# Patient Record
Sex: Female | Born: 2000 | Hispanic: No | Marital: Single | Smoking: Never smoker
Health system: Southern US, Community
[De-identification: ages and names within clinical notes are randomized; demographics above are authoritative.]

## PROBLEM LIST (undated history)

## (undated) DIAGNOSIS — H6690 Otitis media, unspecified, unspecified ear: Secondary | ICD-10-CM

## (undated) DIAGNOSIS — H539 Unspecified visual disturbance: Secondary | ICD-10-CM

## (undated) DIAGNOSIS — Q315 Congenital laryngomalacia: Secondary | ICD-10-CM

## (undated) DIAGNOSIS — J4520 Mild intermittent asthma, uncomplicated: Secondary | ICD-10-CM

## (undated) HISTORY — PX: WISDOM TOOTH EXTRACTION: SHX21

## (undated) HISTORY — DX: Congenital laryngomalacia: Q31.5

## (undated) HISTORY — DX: Unspecified visual disturbance: H53.9

## (undated) HISTORY — DX: Mild intermittent asthma, uncomplicated: J45.20

## (undated) HISTORY — DX: Otitis media, unspecified, unspecified ear: H66.90

---
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
## 2022-01-25 ENCOUNTER — Encounter: Payer: Self-pay | Admitting: Licensed Clinical Social Worker

## 2022-01-25 NOTE — BH Specialist Note (Incomplete)
Integrated Behavioral Health via Telemedicine Visit  01/25/2022 Martha Perry [id number]  Number of Integrated Behavioral Health Clinician visits: 5/6 Session Start time:  Session End time: No data recorded Total time in minutes: No data recorded  Referring Provider: Dr. Karilyn Cota Patient/Family location: Home [facility] Provider location: Clinic All persons participating in visit: Patient and Clinician  Types of Service: Individual psychotherapy and Video visit  I connected with [name] or Video Enabled Telemedicine Application  (Video is Caregility application) and verified that I am speaking with the correct person using two identifiers. Discussed confidentiality: Yes   I discussed the limitations of telemedicine and the availability of in person appointments.  Discussed there is a possibility of technology failure and discussed alternative modes of communication if that failure occurs.  I discussed that engaging in this telemedicine visit, they consent to the provision of behavioral healthcare and the services will be billed under their insurance.  Patient and/or legal guardian expressed understanding and consented to Telemedicine visit: Yes   Presenting Concerns: Patient and/or family reports the following symptoms/concerns: *** Duration of problem: ***; Severity of problem: {Mild/Moderate/Severe:20260}  Patient and/or Family's Strengths/Protective Factors: {CHL AMB BH PROTECTIVE FACTORS:[phone]}  Goals Addressed: Patient will:  Reduce symptoms of: {IBH Symptoms:21014056}   Increase knowledge and/or ability of: {IBH Patient Tools:21014057}   Demonstrate ability to: {IBH Goals:21014053}  Progress towards Goals: {CHL AMB BH PROGRESS TOWARDS GOALS:[phone]}  Interventions: Interventions utilized:  {IBH Interventions:21014054} Standardized Assessments completed: {IBH Screening Tools:21014051}  Patient and/or Family Response: ***  Assessment: Patient currently  experiencing ***.   Patient may benefit from ***.  Plan: Follow up with behavioral health clinician on : *** Behavioral recommendations: *** Referral(s): {IBH Referrals:21014055}  I discussed the assessment and treatment plan with the patient and/or parent/guardian. They were provided an opportunity to ask questions and all were answered. They agreed with the plan and demonstrated an understanding of the instructions.   They were advised to call back or seek an in-person evaluation if the symptoms worsen or if the condition fails to improve as anticipated.  Katheran Awe, [facility]

## 2022-03-16 ENCOUNTER — Telehealth: Payer: Self-pay | Admitting: Pediatrics

## 2022-03-16 NOTE — Telephone Encounter (Signed)
Ok

## 2022-03-16 NOTE — Telephone Encounter (Signed)
Patent emailed in [employer] assessment forms for college. Patient is requesting forms please be filled out by the second week of August and faxed back to [employer]. Please review and place in outgoing box for processing.

## 2022-03-24 NOTE — Telephone Encounter (Signed)
Completed orders scanned to pt. Chart. Faxed to [employer] at pt. Request , Pt. Called for original copy pick up.

## 2022-05-12 ENCOUNTER — Ambulatory Visit (HOSPITAL_BASED_OUTPATIENT_CLINIC_OR_DEPARTMENT_OTHER): Payer: Medicaid Other | Admitting: Nurse Practitioner

## 2022-06-09 ENCOUNTER — Encounter (HOSPITAL_BASED_OUTPATIENT_CLINIC_OR_DEPARTMENT_OTHER): Payer: Self-pay | Admitting: Nurse Practitioner

## 2022-06-09 ENCOUNTER — Ambulatory Visit (INDEPENDENT_AMBULATORY_CARE_PROVIDER_SITE_OTHER): Payer: Medicaid Other | Admitting: Nurse Practitioner

## 2022-06-09 ENCOUNTER — Other Ambulatory Visit (HOSPITAL_BASED_OUTPATIENT_CLINIC_OR_DEPARTMENT_OTHER): Payer: Self-pay

## 2022-06-09 VITALS — BP 106/70 | HR 70 | Ht 63.0 in | Wt 146.2 lb

## 2022-06-09 DIAGNOSIS — E010 Iodine-deficiency related diffuse (endemic) goiter: Secondary | ICD-10-CM | POA: Diagnosis not present

## 2022-06-09 DIAGNOSIS — L749 Eccrine sweat disorder, unspecified: Secondary | ICD-10-CM

## 2022-06-09 DIAGNOSIS — J4520 Mild intermittent asthma, uncomplicated: Secondary | ICD-10-CM

## 2022-06-09 DIAGNOSIS — L910 Hypertrophic scar: Secondary | ICD-10-CM

## 2022-06-09 DIAGNOSIS — Z0000 Encounter for general adult medical examination without abnormal findings: Secondary | ICD-10-CM | POA: Diagnosis not present

## 2022-06-09 MED ORDER — ALBUTEROL SULFATE HFA 108 (90 BASE) MCG/ACT IN AERS: 1.0000 | INHALATION_SPRAY | RESPIRATORY_TRACT | 11 refills | Status: AC | PRN

## 2022-06-09 NOTE — Patient Instructions (Signed)
Thank you for choosing [place] [facility] for your Primary Care needs. I am excited for the opportunity to partner with you to meet your health care goals. It was a pleasure meeting you today!  Recommendations from today's visit: I will let you know what your labs show once these come back. If your thyroid labs are normal, we can consider an ultrasound of the thyroid just to make sure the tissue does not look abnormal or enlarged.   Information on diet, exercise, and health maintenance recommendations are listed below. This is information to help you be sure you are on track for optimal health and monitoring.   Please look over this and let us know if you have any questions or if you have completed any of the health maintenance outside of [place] so that we can be sure your records are up to date.  ___________________________________________________________ About Me: I am an Adult-Geriatric Nurse Practitioner with a background in caring for patients for more than 20 years with a strong intensive care background. I provide primary care and sports medicine services to patients age 27 and older within this office. My education had a strong focus on caring for the older adult population, which I am passionate about. I am also the director of the APP Fellowship with [facility].   My desire is to provide you with the best service through preventive medicine and supportive care. I consider you a part of the medical team and value your input. I work diligently to ensure that you are heard and your needs are met in a safe and effective manner. I want you to feel comfortable with me as your provider and want you to know that your health concerns are important to me.  For your information, our office hours are: Monday, Tuesday, and Thursday 8:00 AM - 5:00 PM Wednesday and Friday 8:00 AM - 12:00 PM.   In my time away from the office I am teaching new APP's within the system and  am unavailable, but my partner, Dr. Burnard Bunting is in the office for emergent needs.   If you have questions or concerns, please call our office at [phone] or send [place] a MyChart message and we will respond as quickly as possible.  ____________________________________________________________ MyChart:  For all urgent or time sensitive needs we ask that you please call the office to avoid delays. Our number is [phone]. MyChart is not constantly monitored and due to the large volume of messages a day, replies may take up to 72 business hours.  MyChart Policy: MyChart allows for you to see your visit notes, after visit summary, provider recommendations, lab and tests results, make an appointment, request refills, and contact your provider or the office for non-urgent questions or concerns. Providers are seeing patients during normal business hours and do not have built in time to review MyChart messages.  We ask that you allow a minimum of 3 business days for responses to [employer]. For this reason, please do not send urgent requests through [place]. Please call the office at [phone]. New and ongoing conditions may require a visit. We have virtual and in person visit available for your convenience.  Complex MyChart concerns may require a visit. Your provider may request you schedule a virtual or in person visit to ensure we are providing the best care possible. MyChart messages sent after 11:00 AM on Friday will not be received by the provider until Monday morning.    Lab  and Test Results: You will receive your lab and test results on MyChart as soon as they are completed and results have been sent by the lab or testing facility. Due to this service, you will receive your results BEFORE your provider.  I review lab and tests results each morning prior to seeing patients. Some results require collaboration with other providers to ensure you are receiving the most appropriate care. For  this reason, we ask that you please allow a minimum of 3-5 business days from the time the ALL results have been received for your provider to receive and review lab and test results and contact you about these.  Most lab and test result comments from the provider will be sent through [place]. Your provider may recommend changes to the plan of care, follow-up visits, repeat testing, ask questions, or request an office visit to discuss these results. You may reply directly to this message or call the office at [phone] to provide information for the provider or set up an appointment. In some instances, you will be called with test results and recommendations. Please let us know if this is preferred and we will make note of this in your chart to provide this for you.    If you have not heard a response to your lab or test results in 5 business days from all results returning to [place], please call the office to let us know. We ask that you please avoid calling prior to this time unless there is an emergent concern. Due to high call volumes, this can delay the resulting process.  After Hours: For all non-emergency after hours needs, please call the office at [phone] and select the option to reach the on-call provider service. On-call services are shared between multiple [place] offices and therefore it will not be possible to speak directly with your provider. On-call providers may provide medical advice and recommendations, but are unable to provide refills for maintenance medications.  For all emergency or urgent medical needs after normal business hours, we recommend that you seek care at the closest Urgent Care or Emergency Department to ensure appropriate treatment in a timely manner.  [facility] [place] has a 24 hour emergency room located on the ground floor for your convenience.   Urgent Concerns During the Business Day Providers are seeing patients from 8AM to [place] with  a busy schedule and are most often not able to respond to non-urgent calls until the end of the day or the next business day. If you should have URGENT concerns during the day, please call and speak to the nurse or schedule a same day appointment so that we can address your concern without delay.   Thank you, again, for choosing me as your health care partner. I appreciate your trust and look forward to learning more about you.   Worthy Keeler, DNP, AGNP-c ___________________________________________________________  Health Maintenance Recommendations Screening Testing Mammogram Every 1 -2 years based on history and risk factors Starting at age 63 Pap Smear Ages 21-39 every 3 years Ages 54-65 every 5 years with HPV testing More frequent testing may be required based on results and history Colon Cancer Screening Every 1-10 years based on test performed, risk factors, and history Starting at age 26 Bone Density Screening Every 2-10 years based on history Starting at age 11 for women Recommendations for men differ based on medication usage, history, and risk factors AAA Screening One time ultrasound Men 64-38 years old who have every  smoked Lung Cancer Screening Low Dose Lung CT every 12 months Age 86-80 years with a 30 pack-year smoking history who still smoke or who have quit within the last 15 years  Screening Labs Routine  Labs: Complete Blood Count (CBC), Complete Metabolic Panel (CMP), Cholesterol (Lipid Panel) Every 6-12 months based on history and medications May be recommended more frequently based on current conditions or previous results Hemoglobin A1c Lab Every 3-12 months based on history and previous results Starting at age 35 or earlier with diagnosis of diabetes, high cholesterol, BMI >26, and/or risk factors Frequent monitoring for patients with diabetes to ensure blood sugar control Thyroid Panel (TSH w/ T3 & T4) Every 6 months based on history, symptoms, and  risk factors May be repeated more often if on medication HIV One time testing for all patients 62 and older May be repeated more frequently for patients with increased risk factors or exposure Hepatitis C One time testing for all patients 39 and older May be repeated more frequently for patients with increased risk factors or exposure Gonorrhea, Chlamydia Every 12 months for all sexually active persons 13-24 years Additional monitoring may be recommended for those who are considered high risk or who have symptoms PSA Men 25-5 years old with risk factors Additional screening may be recommended from age 71-69 based on risk factors, symptoms, and history  Vaccine Recommendations Tetanus Booster All adults every 10 years Flu Vaccine All patients 6 months and older every year COVID Vaccine All patients 12 years and older Initial dosing with booster May recommend additional booster based on age and health history HPV Vaccine 2 doses all patients age 92-26 Dosing may be considered for patients over 26 Shingles Vaccine (Shingrix) 2 doses all adults 39 years and older Pneumonia (Pneumovax 23) All adults 9 years and older May recommend earlier dosing based on health history Pneumonia (Prevnar 50) All adults 32 years and older Dosed 1 year after Pneumovax 23  Additional Screening, Testing, and Vaccinations may be recommended on an individualized basis based on family history, health history, risk factors, and/or exposure.  __________________________________________________________  Diet Recommendations for All Patients  I recommend that all patients maintain a diet low in saturated fats, carbohydrates, and cholesterol. While this can be challenging at first, it is not impossible and small changes can make big differences.  Things to try: Decreasing the amount of soda, sweet tea, and/or juice to one or less per day and replace with water While water is always the first choice, if you  do not like water you may consider adding a water additive without sugar to improve the taste other sugar free drinks Replace potatoes with a brightly colored vegetable at dinner Use healthy oils, such as canola oil or olive oil, instead of butter or hard margarine Limit your bread intake to two pieces or less a day Replace regular pasta with low carb pasta options Bake, broil, or grill foods instead of frying Monitor portion sizes  Eat smaller, more frequent meals throughout the day instead of large meals  An important thing to remember is, if you love foods that are not great for your health, you don't have to give them up completely. Instead, allow these foods to be a reward when you have done well. Allowing yourself to still have special treats every once in a while is a nice way to tell yourself thank you for working hard to keep yourself healthy.   Also remember that every day is a new day. If  you have a bad day and "fall off the wagon", you can still climb right back up and keep moving along on your journey!  We have resources available to help you!  Some websites that may be helpful include: www.http://carter.biz/  Www.VeryWellFit.com _____________________________________________________________  Activity Recommendations for All Patients  I recommend that all adults get at least 20 minutes of moderate physical activity that elevates your heart rate at least 5 days out of the week.  Some examples include: Walking or jogging at a pace that allows you to carry on a conversation Cycling (stationary bike or outdoors) Water aerobics Yoga Weight lifting Dancing If physical limitations prevent you from putting stress on your joints, exercise in a pool or seated in a chair are excellent options.  Do determine your MAXIMUM heart rate for activity: YOUR AGE - 220 = MAX HeartRate   Remember! Do not push yourself too hard.  Start slowly and build up your pace, speed, weight, time in  exercise, etc.  Allow your body to rest between exercise and get good sleep. You will need more water than normal when you are exerting yourself. Do not wait until you are thirsty to drink. Drink with a purpose of getting in at least 8, 8 ounce glasses of water a day plus more depending on how much you exercise and sweat.    If you begin to develop dizziness, chest pain, abdominal pain, jaw pain, shortness of breath, headache, vision changes, lightheadedness, or other concerning symptoms, stop the activity and allow your body to rest. If your symptoms are severe, seek emergency evaluation immediately. If your symptoms are concerning, but not severe, please let us know so that we can recommend further evaluation.

## 2022-06-09 NOTE — Progress Notes (Signed)
BP 106/70   Pulse 70   Ht 5' 3" (1.6 m)   Wt 146 lb 3.2 oz (66.3 kg)   SpO2 98%   BMI 25.90 kg/m    Subjective:    Patient ID: Martha Perry, female    DOB: 2001-02-14, 21 y.o.   MRN: [id number]  HPI: Martha Perry is a 21 y.o. female presenting on 06/09/2022 for comprehensive medical examination.   Current medical concerns include:none  She reports regular vision exams q1-5y: yes She reports regular dental exams q 31m yes Her diet consists of:  no restrictions other than pork She endorses exercise and/or activity of:  walking on campus [place] in[place]  [employer] [place] She denies ETOH use  She denies nictoine use  She denies illegal substance use   She is having menstrual periods.  She  denies abnormal bleeding. She  denies menopausal symptoms.  She is is not sexually active. She currently has 0 sexual partners.  She denies concerns today about STI: testing ordered: no  She endorses concerns about skin changes today -- Keloid She denies concerns about bowel changes today  She denies concerns about bladder changes today   Most Recent Depression Screen:     10/04/2021    5:10 PM 12/25/2019   10:15 AM 10/31/2019    9:17 AM  Depression screen PHQ 2/9  Decreased Interest 1 0 0  Down, Depressed, Hopeless 1 0 2  PHQ - 2 Score 2 0 2  Altered sleeping 1  0  Tired, decreased energy 1  1  Change in appetite 0  2  Feeling bad or failure about yourself  0  1  Trouble concentrating 0  1  Moving slowly or fidgety/restless 0  0  PHQ-9 Score 4  7   Most Recent Anxiety Screen:      No data to display         Most Recent Fall Screen:    12/25/2019   10:15 AM  Fall Risk   Falls in the past year? 0  Number falls in past yr: 0    All ROS negative except what is listed above and in the HPI.   Past medical history, surgical history, medications, allergies, family history and social history reviewed with patient today and changes  made to appropriate areas of the chart.  Past Medical History:  Past Medical History:  Diagnosis Date   Allergic rhinitis 07/26/2011   Asthma, mild intermittent 07/26/2011   asthma exercerbated by colds, ear infections   Laryngomalacia    Otitis media    Twin birth    URI (upper respiratory infection) 01/28/2013   Vision abnormalities    Medications:  Current Outpatient Medications on File Prior to Visit  Medication Sig   fluticasone (FLONASE) 50 MCG/ACT nasal spray 1 spray each nostril once a day as needed congestion.   No current facility-administered medications on file prior to visit.   Surgical History:  Past Surgical History:  Procedure Laterality Date   ADENOIDECTOMY AND MYRINGOTOMY WITH TUBE PLACEMENT Bilateral 09/30/2013   Procedure: BILATERAL  MYRINGOTOMY WITH  TUBE PLACEMENT AND ADENOIDECTOMY;  Surgeon: SAscencion Dike MD;  Location: [facility]  Service: ENT;  Laterality: Bilateral;   Allergies:  Allergies  Allergen Reactions   Other     Multiple environmental/ pollen   Social History:  Social History   Socioeconomic History   Marital status: Single    Spouse name: Not on file  Number of children: Not on file   Years of education: Not on file   Highest education level: Not on file  Occupational History   Not on file  Tobacco Use   Smoking status: Never    Passive exposure: Never   Smokeless tobacco: Never   Tobacco comments:    no smokers in home  Vaping Use   Vaping Use: Never used  Substance and Sexual Activity   Alcohol use: Never   Drug use: Never   Sexual activity: Never  Other Topics Concern   Not on file  Social History Narrative   Lives at home with mother, father, twin sister and younger brother.  Attends [employer] with major in [employer].   Social Determinants of Health   Financial Resource Strain: Not on file  Food Insecurity: Not on file  Transportation Needs: Not on file  Physical Activity: Not  on file  Stress: Not on file  Social Connections: Not on file  Intimate Partner Violence: Not on file   Social History   Tobacco Use  Smoking Status Never   Passive exposure: Never  Smokeless Tobacco Never  Tobacco Comments   no smokers in home   Social History   Substance and Sexual Activity  Alcohol Use Never   Family History:  Family History  Problem Relation Age of Onset   Hyperlipidemia Mother    Hypertension Father        Objective:    BP 106/70   Pulse 70   Ht 5' 3" (1.6 m)   Wt 146 lb 3.2 oz (66.3 kg)   SpO2 98%   BMI 25.90 kg/m   Wt Readings from Last 3 Encounters:  06/09/22 146 lb 3.2 oz (66.3 kg) (79 %, Z= 0.80)*  10/04/21 152 lb (68.9 kg) (85 %, Z= 1.04)*  06/22/20 157 lb (71.2 kg) (90 %, Z= 1.26)*   * Growth percentiles are based on CDC (Girls, 2-20 Years) data.    Physical Exam Vitals and nursing note reviewed.  Constitutional:      General: She is not in acute distress.    Appearance: Normal appearance.  HENT:     Head: Normocephalic and atraumatic.     Right Ear: Hearing, tympanic membrane, ear canal and external ear normal.     Left Ear: Hearing, tympanic membrane, ear canal and external ear normal.     Nose: Nose normal.     Right Sinus: No maxillary sinus tenderness or frontal sinus tenderness.     Left Sinus: No maxillary sinus tenderness or frontal sinus tenderness.     Mouth/Throat:     Lips: Pink.     Mouth: Mucous membranes are moist.     Pharynx: Oropharynx is clear.  Eyes:     General: Lids are normal. Vision grossly intact.     Extraocular Movements: Extraocular movements intact.     Conjunctiva/sclera: Conjunctivae normal.     Pupils: Pupils are equal, round, and reactive to light.     Funduscopic exam:    Right eye: Red reflex present.        Left eye: Red reflex present.    Visual Fields: Right eye visual fields normal and left eye visual fields normal.  Neck:     Thyroid: No thyromegaly.     Vascular: No carotid  bruit.  Cardiovascular:     Rate and Rhythm: Normal rate and regular rhythm.     Chest Wall: PMI is not displaced.  Pulses: Normal pulses.          Dorsalis pedis pulses are 2+ on the right side and 2+ on the left side.       Posterior tibial pulses are 2+ on the right side and 2+ on the left side.     Heart sounds: Normal heart sounds. No murmur heard. Pulmonary:     Effort: Pulmonary effort is normal. No respiratory distress.     Breath sounds: Normal breath sounds.  Abdominal:     General: Abdomen is flat. Bowel sounds are normal. There is no distension.     Palpations: Abdomen is soft. There is no hepatomegaly, splenomegaly or mass.     Tenderness: There is no abdominal tenderness. There is no right CVA tenderness, left CVA tenderness, guarding or rebound.  Musculoskeletal:        General: Normal range of motion.     Cervical back: Full passive range of motion without pain, normal range of motion and neck supple. No tenderness.     Right lower leg: No edema.     Left lower leg: No edema.  Feet:     Left foot:     Toenail Condition: Left toenails are normal.  Lymphadenopathy:     Cervical: No cervical adenopathy.     Upper Body:     Right upper body: No supraclavicular adenopathy.     Left upper body: No supraclavicular adenopathy.  Skin:    General: Skin is warm and dry.     Capillary Refill: Capillary refill takes less than 2 seconds.     Nails: There is no clubbing.  Neurological:     General: No focal deficit present.     Mental Status: She is alert and oriented to person, place, and time.     GCS: GCS eye subscore is 4. GCS verbal subscore is 5. GCS motor subscore is 6.     Sensory: Sensation is intact.     Motor: Motor function is intact.     Coordination: Coordination is intact.     Gait: Gait is intact.     Deep Tendon Reflexes: Reflexes are normal and symmetric.  Psychiatric:        Attention and Perception: Attention normal.        Mood and Affect: Mood  normal.        Speech: Speech normal.        Behavior: Behavior normal. Behavior is cooperative.        Thought Content: Thought content normal.        Cognition and Memory: Cognition and memory normal.        Judgment: Judgment normal.     Results for orders placed or performed in visit on 06/09/22  CBC with Differential/Platelet  Result Value Ref Range   WBC 3.9 3.4 - 10.8 x10E3/uL   RBC 4.62 3.77 - 5.28 x10E6/uL   Hemoglobin 12.1 11.1 - 15.9 g/dL   Hematocrit 36.7 34.0 - 46.6 %   MCV 79 79 - 97 fL   MCH 26.2 (L) 26.6 - 33.0 pg   MCHC 33.0 31.5 - 35.7 g/dL   RDW 14.6 11.7 - 15.4 %   Platelets 375 150 - 450 x10E3/uL   Neutrophils 40 Not Estab. %   Lymphs 49 Not Estab. %   Monocytes 9 Not Estab. %   Eos 1 Not Estab. %   Basos 1 Not Estab. %   Neutrophils Absolute 1.6 1.4 - 7.0 x10E3/uL   Lymphocytes Absolute  2.0 0.7 - 3.1 x10E3/uL   Monocytes Absolute 0.3 0.1 - 0.9 x10E3/uL   EOS (ABSOLUTE) 0.0 0.0 - 0.4 x10E3/uL   Basophils Absolute 0.0 0.0 - 0.2 x10E3/uL   Immature Granulocytes 0 Not Estab. %   Immature Grans (Abs) 0.0 0.0 - 0.1 x10E3/uL  Comprehensive metabolic panel  Result Value Ref Range   Glucose 82 70 - 99 mg/dL   BUN 11 6 - 20 mg/dL   Creatinine, Ser 0.66 0.57 - 1.00 mg/dL   eGFR 130 >59 mL/min/1.73   BUN/Creatinine Ratio 17 9 - 23   Sodium 139 134 - 144 mmol/L   Potassium 4.5 3.5 - 5.2 mmol/L   Chloride 102 96 - 106 mmol/L   CO2 21 20 - 29 mmol/L   Calcium 9.8 8.7 - 10.2 mg/dL   Total Protein 7.7 6.0 - 8.5 g/dL   Albumin 4.7 4.0 - 5.0 g/dL   Globulin, Total 3.0 1.5 - 4.5 g/dL   Albumin/Globulin Ratio 1.6 1.2 - 2.2   Bilirubin Total 0.6 0.0 - 1.2 mg/dL   Alkaline Phosphatase 60 42 - 106 IU/L   AST 13 0 - 40 IU/L   ALT 7 0 - 32 IU/L  TSH  Result Value Ref Range   TSH 1.540 0.450 - 4.500 uIU/mL  T4, free  Result Value Ref Range   Free T4 1.31 0.93 - 1.60 ng/dL    MMUNIZATIONS:   - Tdap: Tetanus vaccination status reviewed: last tetanus booster  within 10 years. - Influenza:  Up-to-date - Pneumovax: Not applicable - Prevnar: Not applicable - HPV: Not applicable - Zostavax vaccine: Not applicable  SCREENING COMPLETED: - Pap smear: Not Applicable - STI testing:Not Applicable -Mammogram: Not Applicable - Colonoscopy: {Not Applicable - Bone Density: {Not Applicable -Hearing Test: Not Applicable -Spirometry: Not Applicable     Assessment & Plan:   Problem List Items Addressed This Visit     Asthma, mild intermittent    Chronic.  Well-controlled with albuterol.  No alarm symptoms present at this time.  Refills provided today.  Recommend follow-up if symptoms worsen or new symptoms develop.      Relevant Medications   albuterol (VENTOLIN HFA) 108 (90 Base) MCG/ACT inhaler   Other Relevant Orders   CBC with Differential/Platelet (Completed)   Comprehensive metabolic panel (Completed)   TSH (Completed)   T4, free (Completed)   Encounter for medical examination to establish care - Primary    New patient in the office today.  CPE performed with no alarm findings.  Will obtain labs for evaluation.  Health maintenance reviewed and recommendations provided.  Anticipatory guidance discussed.  Diet and exercise information provided.  Plan to follow-up in 1 year or sooner if needed.      Relevant Orders   CBC with Differential/Platelet (Completed)   Comprehensive metabolic panel (Completed)   TSH (Completed)   T4, free (Completed)   Thyromegaly    Enlargement of the thyroid noted on physical exam.  Will obtain labs today for evaluation.      Relevant Orders   TSH (Completed)   T4, free (Completed)   Keloid    Patient would like evaluation for keloid and possible removal.  We will send referral to dermatology today for evaluation.      Relevant Orders   Ambulatory referral to Dermatology   Other Visit Diagnoses     Health care maintenance       Relevant Orders   CBC with Differential/Platelet (Completed)    Comprehensive metabolic panel (  Completed)   TSH (Completed)   T4, free (Completed)          Follow up plan: Return in about 1 year (around 06/10/2023) for CPE today- repeat in 1 year.  NEXT PREVENTATIVE PHYSICAL DUE IN 1 YEAR.  PATIENT COUNSELING PROVIDED FOR ALL ADULT PATIENTS:  Consume a well balanced diet low in saturated fats, cholesterol, and moderation in carbohydrates.   This can be as simple as monitoring portion sizes and cutting back on sugary beverages such as soda and juice to start with.    Daily water consumption of at least 64 ounces.  Physical activity at least 180 minutes per week, if just starting out.   This can be as simple as taking the stairs instead of the elevator and walking 2-3 laps around the office  purposefully every day.   STD protection, partner selection, and regular testing if high risk.  Limited consumption of alcoholic beverages if alcohol is consumed.  For women, I recommend no more than 7 alcoholic beverages per week, spread out throughout the week.  Avoid "binge" drinking or consuming large quantities of alcohol in one setting.   Please let me know if you feel you may need help with reduction or quitting alcohol consumption.   Avoidance of nicotine, if used.  Please let me know if you feel you may need help with reduction or quitting nicotine use.   Daily mental health attention.  This can be in the form of 5 minute daily meditation, prayer, journaling, yoga, reflection, etc.   Purposeful attention to your emotions and mental state can significantly improve your overall wellbeing and Health.  Please know that I am here to help you with all of your health care goals and am happy to work with you to find a solution that works best for you.  The greatest advice I have received with any changes in life are to take it one step at a time, that even means if all you can focus on is the next 60 seconds, then do that and celebrate your victories.   With any changes in life, you will have set backs, and that is OK. The important thing to remember is, if you have a set back, it is not a failure, it is an opportunity to try again!  Health Maintenance Recommendations Screening Testing Mammogram Every 1 -2 years based on history and risk factors Starting at age 8 Pap Smear Ages 21-39 every 3 years Ages 71-65 every 5 years with HPV testing More frequent testing may be required based on results and history Colon Cancer Screening Every 1-10 years based on test performed, risk factors, and history Starting at age 38 Bone Density Screening Every 2-10 years based on history Starting at age 85 for women Recommendations for men differ based on medication usage, history, and risk factors AAA Screening One time ultrasound Men 27-82 years old who have every smoked Lung Cancer Screening Low Dose Lung CT every 12 months Age 61-80 years with a 30 pack-year smoking history who still smoke or who have quit within the last 15 years  Screening Labs Routine  Labs: Complete Blood Count (CBC), Complete Metabolic Panel (CMP), Cholesterol (Lipid Panel) Every 6-12 months based on history and medications May be recommended more frequently based on current conditions or previous results Hemoglobin A1c Lab Every 3-12 months based on history and previous results Starting at age 38 or earlier with diagnosis of diabetes, high cholesterol, BMI >26, and/or risk factors Frequent monitoring for  patients with diabetes to ensure blood sugar control Thyroid Panel (TSH w/ T3 & T4) Every 6 months based on history, symptoms, and risk factors May be repeated more often if on medication HIV One time testing for all patients 74 and older May be repeated more frequently for patients with increased risk factors or exposure Hepatitis C One time testing for all patients 55 and older May be repeated more frequently for patients with increased risk factors or  exposure Gonorrhea, Chlamydia Every 12 months for all sexually active persons 13-24 years Additional monitoring may be recommended for those who are considered high risk or who have symptoms PSA Men 31-72 years old with risk factors Additional screening may be recommended from age 35-69 based on risk factors, symptoms, and history  Vaccine Recommendations Tetanus Booster All adults every 10 years Flu Vaccine All patients 6 months and older every year COVID Vaccine All patients 12 years and older Initial dosing with booster May recommend additional booster based on age and health history HPV Vaccine 2 doses all patients age 24-26 Dosing may be considered for patients over 26 Shingles Vaccine (Shingrix) 2 doses all adults 51 years and older Pneumonia (Pneumovax 23) All adults 2 years and older May recommend earlier dosing based on health history Pneumonia (Prevnar 20) All adults 12 years and older Dosed 1 year after Pneumovax 23  Additional Screening, Testing, and Vaccinations may be recommended on an individualized basis based on family history, health history, risk factors, and/or exposure.

## 2022-06-10 LAB — CBC WITH DIFFERENTIAL/PLATELET
Basophils Absolute: 0 10*3/uL (ref 0.0–0.2)
Basos: 1 %
EOS (ABSOLUTE): 0 10*3/uL (ref 0.0–0.4)
Eos: 1 %
Hematocrit: 36.7 % (ref 34.0–46.6)
Hemoglobin: 12.1 g/dL (ref 11.1–15.9)
Immature Grans (Abs): 0 10*3/uL (ref 0.0–0.1)
Immature Granulocytes: 0 %
Lymphocytes Absolute: 2 10*3/uL (ref 0.7–3.1)
Lymphs: 49 %
MCH: 26.2 pg — ABNORMAL LOW (ref 26.6–33.0)
MCHC: 33 g/dL (ref 31.5–35.7)
MCV: 79 fL (ref 79–97)
Monocytes Absolute: 0.3 10*3/uL (ref 0.1–0.9)
Monocytes: 9 %
Neutrophils Absolute: 1.6 10*3/uL (ref 1.4–7.0)
Neutrophils: 40 %
Platelets: 375 10*3/uL (ref 150–450)
RBC: 4.62 x10E6/uL (ref 3.77–5.28)
RDW: 14.6 % (ref 11.7–15.4)
WBC: 3.9 10*3/uL (ref 3.4–10.8)

## 2022-06-10 LAB — COMPREHENSIVE METABOLIC PANEL
ALT: 7 IU/L (ref 0–32)
AST: 13 IU/L (ref 0–40)
Albumin/Globulin Ratio: 1.6 (ref 1.2–2.2)
Albumin: 4.7 g/dL (ref 4.0–5.0)
Alkaline Phosphatase: 60 IU/L (ref 42–106)
BUN/Creatinine Ratio: 17 (ref 9–23)
BUN: 11 mg/dL (ref 6–20)
Bilirubin Total: 0.6 mg/dL (ref 0.0–1.2)
CO2: 21 mmol/L (ref 20–29)
Calcium: 9.8 mg/dL (ref 8.7–10.2)
Chloride: 102 mmol/L (ref 96–106)
Creatinine, Ser: 0.66 mg/dL (ref 0.57–1.00)
Globulin, Total: 3 g/dL (ref 1.5–4.5)
Glucose: 82 mg/dL (ref 70–99)
Potassium: 4.5 mmol/L (ref 3.5–5.2)
Sodium: 139 mmol/L (ref 134–144)
Total Protein: 7.7 g/dL (ref 6.0–8.5)
eGFR: 130 mL/min/{1.73_m2} (ref >59)

## 2022-06-10 LAB — T4, FREE: Free T4: 1.31 ng/dL (ref 0.93–1.60)

## 2022-06-10 LAB — TSH: TSH: 1.54 u[IU]/mL (ref 0.450–4.500)

## 2022-06-14 ENCOUNTER — Other Ambulatory Visit (HOSPITAL_BASED_OUTPATIENT_CLINIC_OR_DEPARTMENT_OTHER): Payer: Self-pay

## 2022-06-14 MED ORDER — ALUMINUM CHLORIDE 20 % EX SOLN
CUTANEOUS | 11 refills | Status: DC
  Filled 2022-06-14: qty 35, 30d supply, fill #0

## 2022-06-15 ENCOUNTER — Other Ambulatory Visit (HOSPITAL_BASED_OUTPATIENT_CLINIC_OR_DEPARTMENT_OTHER): Payer: Self-pay

## 2022-06-15 ENCOUNTER — Encounter (HOSPITAL_BASED_OUTPATIENT_CLINIC_OR_DEPARTMENT_OTHER): Payer: Self-pay | Admitting: Pharmacist

## 2022-06-16 ENCOUNTER — Other Ambulatory Visit (HOSPITAL_BASED_OUTPATIENT_CLINIC_OR_DEPARTMENT_OTHER): Payer: Self-pay

## 2022-06-20 ENCOUNTER — Other Ambulatory Visit (HOSPITAL_BASED_OUTPATIENT_CLINIC_OR_DEPARTMENT_OTHER): Payer: Self-pay

## 2022-06-20 ENCOUNTER — Other Ambulatory Visit: Payer: Self-pay | Admitting: Nurse Practitioner

## 2022-06-20 DIAGNOSIS — J302 Other seasonal allergic rhinitis: Secondary | ICD-10-CM

## 2022-06-20 DIAGNOSIS — J309 Allergic rhinitis, unspecified: Secondary | ICD-10-CM

## 2022-06-20 MED ORDER — CETIRIZINE HCL 10 MG PO TABS
10.0000 mg | ORAL_TABLET | Freq: Every evening | ORAL | 2 refills | Status: AC
  Filled 2022-06-20: qty 30, 30d supply, fill #0

## 2022-06-23 ENCOUNTER — Other Ambulatory Visit (HOSPITAL_BASED_OUTPATIENT_CLINIC_OR_DEPARTMENT_OTHER): Payer: Self-pay

## 2022-06-28 DIAGNOSIS — L910 Hypertrophic scar: Secondary | ICD-10-CM | POA: Insufficient documentation

## 2022-06-28 DIAGNOSIS — Z0000 Encounter for general adult medical examination without abnormal findings: Secondary | ICD-10-CM | POA: Insufficient documentation

## 2022-06-28 DIAGNOSIS — E010 Iodine-deficiency related diffuse (endemic) goiter: Secondary | ICD-10-CM | POA: Insufficient documentation

## 2022-06-28 NOTE — Assessment & Plan Note (Signed)
Enlargement of the thyroid noted on physical exam.  Will obtain labs today for evaluation.

## 2022-06-28 NOTE — Assessment & Plan Note (Signed)
Patient would like evaluation for keloid and possible removal.  We will send referral to dermatology today for evaluation.

## 2022-06-28 NOTE — Assessment & Plan Note (Signed)
Chronic.  Well-controlled with albuterol.  No alarm symptoms present at this time.  Refills provided today.  Recommend follow-up if symptoms worsen or new symptoms develop.

## 2022-06-28 NOTE — Assessment & Plan Note (Signed)
New patient in the office today.  CPE performed with no alarm findings.  Will obtain labs for evaluation.  Health maintenance reviewed and recommendations provided.  Anticipatory guidance discussed.  Diet and exercise information provided.  Plan to follow-up in 1 year or sooner if needed.

## 2023-03-16 ENCOUNTER — Ambulatory Visit: Payer: Medicaid Other | Admitting: Dermatology

## 2023-03-21 ENCOUNTER — Ambulatory Visit: Payer: Medicaid Other | Admitting: Dermatology

## 2023-05-11 ENCOUNTER — Encounter: Payer: Self-pay | Admitting: *Deleted

## 2023-06-12 ENCOUNTER — Encounter (HOSPITAL_BASED_OUTPATIENT_CLINIC_OR_DEPARTMENT_OTHER): Payer: Medicaid Other | Admitting: Nurse Practitioner

## 2024-05-17 ENCOUNTER — Encounter: Payer: Self-pay | Admitting: *Deleted

## 2024-06-06 ENCOUNTER — Encounter (HOSPITAL_BASED_OUTPATIENT_CLINIC_OR_DEPARTMENT_OTHER): Admitting: Family Medicine

## 2024-06-07 ENCOUNTER — Other Ambulatory Visit: Payer: Self-pay | Admitting: Medical Genetics

## 2024-06-11 ENCOUNTER — Encounter (HOSPITAL_BASED_OUTPATIENT_CLINIC_OR_DEPARTMENT_OTHER): Payer: Self-pay | Admitting: Family Medicine

## 2024-06-11 ENCOUNTER — Ambulatory Visit (INDEPENDENT_AMBULATORY_CARE_PROVIDER_SITE_OTHER): Admitting: Family Medicine

## 2024-06-11 VITALS — BP 111/64 | HR 76 | Ht 63.0 in | Wt 144.0 lb

## 2024-06-11 DIAGNOSIS — Z7689 Persons encountering health services in other specified circumstances: Secondary | ICD-10-CM

## 2024-06-11 DIAGNOSIS — Z23 Encounter for immunization: Secondary | ICD-10-CM | POA: Diagnosis not present

## 2024-06-11 DIAGNOSIS — L910 Hypertrophic scar: Secondary | ICD-10-CM

## 2024-06-11 DIAGNOSIS — L700 Acne vulgaris: Secondary | ICD-10-CM

## 2024-06-11 MED ORDER — TRETINOIN 0.01 % EX GEL: Freq: Every day | CUTANEOUS | 0 refills | Status: AC

## 2024-06-11 NOTE — Progress Notes (Signed)
 Subjective:   Martha Perry [birth year] 06/11/2024  Chief Complaint  Patient presents with   New Patient (Initial Visit)    Patient is here today for transfer of care. Is needing a referral to dermatology due to problems with acne and to have a place behind ear removed.    Discussed the use of AI scribe software for clinical note transcription with the patient, who gave verbal consent to proceed.  History of Present Illness Martha Perry is a 23 year old female who presents for a dermatology referral and acne concerns.  She has a keloid located behind her left ear, which developed over two years ago following an ear piercing with a gun. It has not grown in size recently.  She has new concerns about acne on her face. She has tried various skincare products from stores like [employer] and [employer], including 'Good Molecules' and 'Murad', as well as a cleanser from [employer]. She used Differin in the past, about three years ago, but did not use it consistently enough to determine its effectiveness. Her acne worsens around her menstrual cycle.      The following portions of the patient's history were reviewed and updated as appropriate: past medical history, past surgical history, family history, social history, allergies, medications, and problem list.   Patient Active Problem List   Diagnosis Date Noted   Encounter for medical examination to establish care 06/28/2022   Thyromegaly 06/28/2022   Keloid 06/28/2022   Allergic rhinitis 07/26/2011   Asthma, mild intermittent 07/26/2011   Past Medical History:  Diagnosis Date   Allergic rhinitis 07/26/2011   Asthma, mild intermittent 07/26/2011   asthma exercerbated by colds, ear infections   Laryngomalacia    Otitis media    Twin birth    URI (upper respiratory infection) 01/28/2013   Vision abnormalities    Past Surgical History:  Procedure Laterality Date   ADENOIDECTOMY AND MYRINGOTOMY WITH TUBE PLACEMENT Bilateral  09/30/2013   Procedure: BILATERAL  MYRINGOTOMY WITH  TUBE PLACEMENT AND ADENOIDECTOMY;  Surgeon: Ana LELON Moccasin, MD;  Location: [facility];  Service: ENT;  Laterality: Bilateral;   WISDOM TOOTH EXTRACTION     Family History  Problem Relation Age of Onset   Hyperlipidemia Mother    Other Mother        Prediabetes   Hypertension Father    Outpatient Medications Prior to Visit  Medication Sig Dispense Refill   albuterol  (VENTOLIN  HFA) 108 (90 Base) MCG/ACT inhaler Inhale 1-2 puffs into the lungs every 4 (four) hours as needed for wheezing. 8 g 11   cetirizine  (ZYRTEC ) 10 MG tablet Take 1 tablet (10 mg total) by mouth nightly as needed for allergies. 30 tablet 2   chlorhexidine (PERIDEX) 0.12 % solution      fluticasone  (FLONASE ) 50 MCG/ACT nasal spray 1 spray each nostril once a day as needed congestion. 16 g 2   ibuprofen  (ADVIL ) 800 MG tablet Take 800 mg by mouth every 6 (six) hours as needed.     aluminum  chloride (DRYSOL) 20 % external solution Apply to affected area at bedtime; once excessive sweating decreases (typically after 2 or more treatments),decrease application to 1-2 times/week. 60 mL 11   No facility-administered medications prior to visit.   Allergies  Allergen Reactions   Other     Multiple environmental/ pollen     ROS: A complete ROS was performed with pertinent positives/negatives noted in the HPI. The remainder of the ROS are negative.  Objective:   Today's Vitals   06/11/24 1307  BP: 111/64  Pulse: 76  SpO2: 100%  Weight: 144 lb (65.3 kg)  Height: 5' 3 (1.6 m)    Physical Exam   GENERAL: Well-appearing, in NAD. Well nourished.  SKIN: Pink, warm and dry. Moderate acne vulgaris scattered to facial areas of forehead, chin, and cheeks. Approx. 1 cm keloid present to post auricular area of left ear.  Head: Normocephalic. NECK: Trachea midline. Full ROM w/o pain or tenderness. No lymphadenopathy.  EYES: Conjunctiva clear without exudates.  EOMI, PERRL, no drainage present.  RESPIRATORY: Chest wall symmetrical. Respirations even and non-labored. MSK: Muscle tone and strength appropriate for age. NEUROLOGIC: No motor or sensory deficits. Steady, even gait. C2-C12 intact.  PSYCH/MENTAL STATUS: Alert, oriented x 3. Cooperative, appropriate mood and affect.   Health Maintenance Due  Topic Date Due   Pneumococcal Vaccine (1 of 1 - PPSV23, PCV20, or PCV21) 06/21/2009   HIV Screening  Never done   Meningococcal B Vaccine (1 of 2 - Standard) Never done   Hepatitis C Screening  Never done   COVID-19 Vaccine (3 - 2025-26 season) 04/29/2024       Assessment & Plan:   1. Encounter to establish care with new doctor (Primary) Discussed role of PCP with patient and reviewed her medical, surgical, and family history.   2. Keloid Referral placed to Dermatology with picture in the chart for evaluation.  - Ambulatory referral to Dermatology  3. Acne vulgaris - Advised the patient to use a face wash that contains Benzoyl peroxide starting 1-2x a week and decrease if dryness occurs.  - Prescribed Retin-A (tretinoin) cream 0.025% and instructed the patient to use it once day at night approx. 1-2x a week and increase tolerance. - Warned the patient that Benzoyl peroxide can stain the towels and sheets, so advised that they use the same towels and pillowcases to avoid discoloring too many items - Warned the patient that Retin-A can dry out the skin and that they may need to use a moisturizing cream if any small areas of dry skin develop. Recommend starting daily SPF as well.   4. Encounter for immunization - Flu vaccine trivalent PF, 6mos and older(Flulaval,Afluria,Fluarix,Fluzone)   Meds ordered this encounter  Medications   tretinoin (RETIN-A) 0.01 % gel    Sig: Apply topically at bedtime.    Dispense:  45 g    Refill:  0    Supervising Provider:   DE PERU, RAYMOND J [[id number]]   Lab Orders  No laboratory test(s) ordered today    No images are attached to the encounter or orders placed in the encounter.  Return in about 4 months (around 10/12/2024) for ANNUAL PHYSICAL (fasting labs at visit) .    Patient to reach out to office if new, worrisome, or unresolved symptoms arise or if no improvement in patient's condition. Patient verbalized understanding and is agreeable to treatment plan. All questions answered to patient's satisfaction.    Martha Perry, [place]

## 2024-06-11 NOTE — Patient Instructions (Signed)
 Understanding Acne: A Dermatology Patient Handout  What is Acne? Acne is a common skin condition that occurs when hair follicles become clogged with oil and dead skin cells. This often leads to the formation of pimples, blackheads, whiteheads, or cysts on the skin.  Causes and Mechanism of Action: Acne can be caused by various factors, including:  1. Excess Oil Production: Overproduction of oil (sebum) by the sebaceous glands can clog pores and lead to acne. 2. Dead Skin Cells: Buildup of dead skin cells on the skin's surface can block pores and contribute to acne. 3. Bacteria: Propionibacterium acnes (P. acnes) bacteria can multiply in clogged pores, leading to inflammation and acne. 4. Hormonal Changes: Fluctuations in hormone levels, particularly during puberty, menstruation, pregnancy, or hormonal disorders, can trigger acne.  Hygiene and Environmental Factors: Maintaining good hygiene practices can help prevent and manage acne. It's essential to:  [facility] your face twice daily with a gentle cleanser to remove excess oil, dirt, and dead skin cells. - Avoid excessive scrubbing or harsh products, as they can irritate the skin and worsen acne. - Keep hair clean and away from the face, as oils and dirt from the hair can exacerbate acne. - Avoid picking or squeezing pimples, as this can lead to scarring and further inflammation. - Consider environmental factors such as humidity, pollution, and sweating, which can aggravate acne.  Importance of Diet: While diet alone may not cause acne, certain foods can potentially worsen or trigger breakouts in some individuals. It's advisable to:  - Limit intake of high-glycemic foods like sugary snacks and processed carbohydrates, as they may contribute to acne. - Consider reducing dairy consumption, as some studies suggest a link between dairy products and acne. - Stay hydrated by drinking plenty of water, as dehydration can affect skin health.  Common  Treatments: Treatment for acne depends on its severity and underlying causes. Common treatment options include:  1. Topical Treatments:    - Over-the-counter creams or gels containing benzoyl peroxide, salicylic acid, or retinoids can help unclog pores and reduce inflammation.    - Prescription-strength topical medications like retinoids, antibiotics, or combination therapies may be recommended for more severe acne.  2. Oral Medications:    - Oral antibiotics such as doxycycline or minocycline may be prescribed to reduce acne-causing bacteria and inflammation.    - Hormonal therapies like oral contraceptives or spironolactone may be prescribed for women with hormonal acne.    - Isotretinoin (Accutane) is a potent oral medication reserved for severe, treatment-resistant acne. It reduces oil production and prevents pore blockage.  Why Prescriptions and Oral Medications May Be Needed: In cases of severe or hormonal acne, topical treatments alone may not be sufficient to control breakouts. Prescription medications and oral treatments may be necessary to:  - Address underlying hormonal imbalances contributing to acne. - Target acne-causing bacteria more effectively. - Reduce inflammation and prevent scarring.  Daily Regimen Template:  Morning: 1. Wash your face with a gentle cleanser (Cetaphil, CeraVe, Neutrogena) 2. Apply a pea-sized amount of topical acne treatment (if prescribed) to affected areas. 3. Follow with a moisturizer and sunscreen suitable for acne-prone skin.  Evening: 1. Wash your face with a gentle cleanser (Cetaphil, CeraVe, Neutrogena) 2. Apply a pea-sized amount of topical retinoid (differin or tretinoin) to entire face.  Start only using 2-3 night per week and gradually increase as tolerated. 3. Apply a non-comedogenic moisturizer to keep the skin hydrated overnight (Neutrogena, CeraVe, Cetaphil) 4. Wear an SPF based moisturizer daily.  Note: Always follow your  dermatologist's recommendations and treatment plan for best results. Consistency is key to managing acne effectively. If you experience any severe side effects or worsening of symptoms, consult your healthcare provider promptly.

## 2024-09-03 DIAGNOSIS — Z006 Encounter for examination for normal comparison and control in clinical research program: Secondary | ICD-10-CM

## 2024-09-03 NOTE — Telephone Encounter (Unsigned)
 Copied from CRM [id number]. Topic: Clinical - Request for Lab/Test Order >> Sep 03, 2024  2:33 PM Rosaria BRAVO wrote: Reason for CRM: Pt would like to know their blood type, please advise with lab orders.   Best contact: [phone]

## 2024-12-02 ENCOUNTER — Encounter (HOSPITAL_BASED_OUTPATIENT_CLINIC_OR_DEPARTMENT_OTHER): Admitting: Family Medicine

## 2025-01-14 ENCOUNTER — Ambulatory Visit: Admitting: Physician Assistant
